# Patient Record
Sex: Female | Born: 1985 | Race: White | Hispanic: No | Marital: Married | Smoking: Former smoker
Health system: Southern US, Community
[De-identification: ages and names within clinical notes are randomized; demographics above are authoritative.]

## PROBLEM LIST (undated history)

## (undated) DIAGNOSIS — R7301 Impaired fasting glucose: Secondary | ICD-10-CM

## (undated) DIAGNOSIS — N912 Amenorrhea, unspecified: Secondary | ICD-10-CM

## (undated) DIAGNOSIS — R7989 Other specified abnormal findings of blood chemistry: Secondary | ICD-10-CM

## (undated) HISTORY — PX: WISDOM TOOTH EXTRACTION: SHX21

---

## 2019-04-03 ENCOUNTER — Other Ambulatory Visit: Payer: Self-pay | Admitting: Gastroenterology

## 2019-04-03 DIAGNOSIS — R1031 Right lower quadrant pain: Secondary | ICD-10-CM

## 2019-04-03 DIAGNOSIS — R1032 Left lower quadrant pain: Secondary | ICD-10-CM

## 2019-04-10 ENCOUNTER — Ambulatory Visit
Admission: RE | Admit: 2019-04-10 | Discharge: 2019-04-10 | Disposition: A | Discharge status: Home / Self Care | Payer: Managed Care, Other (non HMO) | Source: Ambulatory Visit | Attending: Gastroenterology | Admitting: Gastroenterology

## 2019-04-10 ENCOUNTER — Other Ambulatory Visit: Payer: Self-pay

## 2019-04-10 DIAGNOSIS — R1032 Left lower quadrant pain: Secondary | ICD-10-CM | POA: Diagnosis present

## 2019-04-10 DIAGNOSIS — R1031 Right lower quadrant pain: Secondary | ICD-10-CM | POA: Insufficient documentation

## 2019-04-10 MED ORDER — IOHEXOL 300 MG/ML  SOLN
100.0000 mL | Freq: Once | INTRAMUSCULAR | Status: AC | PRN
  Administered 2019-04-10: 100 mL via INTRAVENOUS

## 2019-07-12 ENCOUNTER — Other Ambulatory Visit: Payer: Managed Care, Other (non HMO) | Attending: General Surgery

## 2019-10-19 ENCOUNTER — Other Ambulatory Visit: Payer: Self-pay

## 2019-10-19 ENCOUNTER — Other Ambulatory Visit
Admission: RE | Admit: 2019-10-19 | Discharge: 2019-10-19 | Disposition: A | Discharge status: Home / Self Care | Payer: Managed Care, Other (non HMO) | Source: Ambulatory Visit | Attending: Internal Medicine | Admitting: Internal Medicine

## 2019-10-19 DIAGNOSIS — Z20822 Contact with and (suspected) exposure to covid-19: Secondary | ICD-10-CM | POA: Diagnosis not present

## 2019-10-19 DIAGNOSIS — Z01812 Encounter for preprocedural laboratory examination: Secondary | ICD-10-CM | POA: Diagnosis present

## 2019-10-19 LAB — SARS CORONAVIRUS 2 (TAT 6-24 HRS): SARS Coronavirus 2: NEGATIVE

## 2019-10-20 ENCOUNTER — Encounter: Payer: Self-pay | Admitting: Internal Medicine

## 2019-10-23 ENCOUNTER — Encounter
Admission: RE | Disposition: A | Discharge status: Home / Self Care | Payer: Self-pay | Source: Home / Self Care | Attending: Internal Medicine

## 2019-10-23 ENCOUNTER — Encounter: Payer: Self-pay | Admitting: Internal Medicine

## 2019-10-23 ENCOUNTER — Ambulatory Visit: Payer: Managed Care, Other (non HMO) | Admitting: Anesthesiology

## 2019-10-23 ENCOUNTER — Other Ambulatory Visit: Payer: Self-pay

## 2019-10-23 ENCOUNTER — Ambulatory Visit
Admission: RE | Admit: 2019-10-23 | Discharge: 2019-10-23 | Disposition: A | Discharge status: Home / Self Care | Payer: Managed Care, Other (non HMO) | Attending: Internal Medicine | Admitting: Internal Medicine

## 2019-10-23 DIAGNOSIS — K640 First degree hemorrhoids: Secondary | ICD-10-CM | POA: Diagnosis not present

## 2019-10-23 DIAGNOSIS — F17200 Nicotine dependence, unspecified, uncomplicated: Secondary | ICD-10-CM | POA: Diagnosis not present

## 2019-10-23 DIAGNOSIS — R634 Abnormal weight loss: Secondary | ICD-10-CM | POA: Insufficient documentation

## 2019-10-23 DIAGNOSIS — Z6841 Body Mass Index (BMI) 40.0 and over, adult: Secondary | ICD-10-CM | POA: Diagnosis not present

## 2019-10-23 DIAGNOSIS — R1030 Lower abdominal pain, unspecified: Secondary | ICD-10-CM | POA: Diagnosis not present

## 2019-10-23 DIAGNOSIS — K635 Polyp of colon: Secondary | ICD-10-CM | POA: Diagnosis not present

## 2019-10-23 DIAGNOSIS — R194 Change in bowel habit: Secondary | ICD-10-CM | POA: Diagnosis not present

## 2019-10-23 HISTORY — DX: Impaired fasting glucose: R73.01

## 2019-10-23 HISTORY — DX: Other specified abnormal findings of blood chemistry: R79.89

## 2019-10-23 HISTORY — PX: COLONOSCOPY WITH PROPOFOL: SHX5780

## 2019-10-23 HISTORY — DX: Amenorrhea, unspecified: N91.2

## 2019-10-23 LAB — POCT PREGNANCY, URINE
Preg Test, Ur: NEGATIVE
Preg Test, Ur: NEGATIVE

## 2019-10-23 SURGERY — COLONOSCOPY WITH PROPOFOL
Anesthesia: General

## 2019-10-23 MED ORDER — LIDOCAINE HCL (CARDIAC) PF 100 MG/5ML IV SOSY
PREFILLED_SYRINGE | INTRAVENOUS | Status: DC | PRN
  Administered 2019-10-23: 40 mg via INTRAVENOUS

## 2019-10-23 MED ORDER — PROPOFOL 500 MG/50ML IV EMUL
INTRAVENOUS | Status: DC | PRN
  Administered 2019-10-23: 150 ug/kg/min via INTRAVENOUS

## 2019-10-23 MED ORDER — PROPOFOL 10 MG/ML IV BOLUS
INTRAVENOUS | Status: DC | PRN
  Administered 2019-10-23: 90 mg via INTRAVENOUS

## 2019-10-23 MED ORDER — MIDAZOLAM HCL 2 MG/2ML IJ SOLN
INTRAMUSCULAR | Status: DC | PRN
  Administered 2019-10-23: 2 mg via INTRAVENOUS

## 2019-10-23 MED ORDER — PROPOFOL 500 MG/50ML IV EMUL
INTRAVENOUS | Status: AC
  Filled 2019-10-23: qty 50

## 2019-10-23 MED ORDER — MIDAZOLAM HCL 2 MG/2ML IJ SOLN
INTRAMUSCULAR | Status: AC
  Filled 2019-10-23: qty 2

## 2019-10-23 MED ORDER — SODIUM CHLORIDE 0.9 % IV SOLN
INTRAVENOUS | Status: DC
  Administered 2019-10-23: 1000 mL via INTRAVENOUS

## 2019-10-23 NOTE — Anesthesia Preprocedure Evaluation (Signed)
Anesthesia Evaluation  Patient identified by MRN, date of birth, ID band Patient awake    Reviewed: Allergy & Precautions, H&P , NPO status , Patient's Chart, lab work & pertinent test results  History of Anesthesia Complications Negative for: history of anesthetic complications  Airway Mallampati: III  TM Distance: >3 FB Neck ROM: full    Dental  (+) Chipped   Pulmonary neg shortness of breath, Current Smoker and Patient abstained from smoking.,    Pulmonary exam normal        Cardiovascular Exercise Tolerance: Good (-) angina(-) Past MI and (-) DOE negative cardio ROS Normal cardiovascular exam     Neuro/Psych negative neurological ROS  negative psych ROS   GI/Hepatic negative GI ROS, Neg liver ROS, neg GERD  ,  Endo/Other  diabetesMorbid obesity  Renal/GU negative Renal ROS  negative genitourinary   Musculoskeletal   Abdominal   Peds  Hematology negative hematology ROS (+)   Anesthesia Other Findings Past Medical History: No date: Amenorrhea No date: High serum testosterone No date: IFG (impaired fasting glucose)  Past Surgical History: No date: WISDOM TOOTH EXTRACTION  BMI    Body Mass Index: 43.90 kg/m      Reproductive/Obstetrics negative OB ROS                             Anesthesia Physical Anesthesia Plan  ASA: III  Anesthesia Plan: General   Post-op Pain Management:    Induction: Intravenous  PONV Risk Score and Plan: Propofol infusion and TIVA  Airway Management Planned: Natural Airway and Nasal Cannula  Additional Equipment:   Intra-op Plan:   Post-operative Plan:   Informed Consent: I have reviewed the patients History and Physical, chart, labs and discussed the procedure including the risks, benefits and alternatives for the proposed anesthesia with the patient or authorized representative who has indicated his/her understanding and acceptance.      Dental Advisory Given  Plan Discussed with: Anesthesiologist, CRNA and Surgeon  Anesthesia Plan Comments: (Patient consented for risks of anesthesia including but not limited to:  - adverse reactions to medications - risk of intubation if required - damage to eyes, teeth, lips or other oral mucosa - nerve damage due to positioning  - sore throat or hoarseness - Damage to heart, brain, nerves, lungs, other parts of body or loss of life  Patient voiced understanding.)        Anesthesia Quick Evaluation

## 2019-10-23 NOTE — Interval H&P Note (Signed)
History and Physical Interval Note:  10/23/2019 3:43 PM  Casey Ward  has presented today for surgery, with the diagnosis of BIL.LOWER ABDOMINAL PAIN.  The various methods of treatment have been discussed with the patient and family. After consideration of risks, benefits and other options for treatment, the patient has consented to  Procedure(s): COLONOSCOPY WITH PROPOFOL (N/A) as a surgical intervention.  The patient's history has been reviewed, patient examined, no change in status, stable for surgery.  I have reviewed the patient's chart and labs.  Questions were answered to the patient's satisfaction.     [place], [place]

## 2019-10-23 NOTE — H&P (Signed)
Outpatient short stay form Pre-procedure 10/23/2019 3:42 PM Dereke Neumann K. Norma Fredrickson, M.D.  Primary Physician: None  Reason for visit: Change in bowel habits, weight loss  History of present illness: 34 y/o female with hx of chronic constipation c/o weight loss and abdominal bloating. No family history of IBD or colon cancer.    Current Facility-Administered Medications:  .  0.9 %  sodium chloride infusion, , Intravenous, Continuous, [place], Boykin Nearing, MD, Last Rate: 20 mL/hr at 10/23/19 1539, Continued from Pre-op at 10/23/19 1539  Medications Prior to Admission  Medication Sig Dispense Refill Last Dose  . hyoscyamine (LEVSIN SL) 0.125 MG SL tablet Place 0.125 mg under the tongue every 4 (four) hours as needed.        No Known Allergies   Past Medical History:  Diagnosis Date  . Amenorrhea   . High serum testosterone   . IFG (impaired fasting glucose)     Review of systems:  Otherwise negative.    Physical Exam  Gen: Alert, oriented. Appears stated age.  HEENT: [place]/AT. PERRLA. Lungs: CTA, no wheezes. CV: RR nl S1, S2. Abd: soft, benign, no masses. BS+ Ext: No edema. Pulses 2+    Planned procedures: Proceed with colonoscopy. The patient understands the nature of the planned procedure, indications, risks, alternatives and potential complications including but not limited to bleeding, infection, perforation, damage to internal organs and possible oversedation/side effects from anesthesia. The patient agrees and gives consent to proceed.  Please refer to procedure notes for findings, recommendations and patient disposition/instructions.     Taden Witter K. Norma Fredrickson, M.D. Gastroenterology 10/23/2019  3:42 PM

## 2019-10-23 NOTE — Anesthesia Procedure Notes (Addendum)
Date/Time: 10/23/2019 3:56 PM Performed by: Stormy Fabian, CRNA Pre-anesthesia Checklist: Patient identified, Emergency Drugs available, Suction available and Patient being monitored Patient Re-evaluated:Patient Re-evaluated prior to induction Oxygen Delivery Method: Supernova nasal CPAP Induction Type: IV induction Dental Injury: Teeth and Oropharynx as per pre-operative assessment  Comments: Nasal cannula with etCO2 monitoring

## 2019-10-23 NOTE — Op Note (Addendum)
[facility] Gastroenterology Patient Name: Casey Ward Procedure Date: 10/23/2019 3:52 PM MRN: [id number] Account #: [id number] Date of Birth: 11-21-1985 Admit Type: Outpatient Age: 34 Room: [facility] ENDO ROOM 3 Gender: Female Note Status: Finalized Procedure:             Colonoscopy Indications:           Lower abdominal pain, Change in bowel habits, Weight                         loss Providers:             Boykin Nearing. Norma Fredrickson MD, MD Referring MD:          No Local Md, MD (Referring MD) Medicines:             Propofol per Anesthesia Complications:         No immediate complications. Procedure:             Pre-Anesthesia Assessment:                        - The risks and benefits of the procedure and the                         sedation options and risks were discussed with the                         patient. All questions were answered and informed                         consent was obtained.                        - Patient identification and proposed procedure were                         verified prior to the procedure by the nurse. The                         procedure was verified in the procedure room.                        - ASA Grade Assessment: III - A patient with severe                         systemic disease.                        - After reviewing the risks and benefits, the patient                         was deemed in satisfactory condition to undergo the                         procedure.                        - After reviewing the risks and benefits, the patient                         was deemed in satisfactory condition  to undergo the                         procedure.                        After obtaining informed consent, the colonoscope was                         passed under direct vision. Throughout the procedure,                         the patient's blood pressure, pulse, and oxygen                         saturations were  monitored continuously. The                         Colonoscope was introduced through the anus and                         advanced to the the cecum, identified by appendiceal                         orifice and ileocecal valve. The colonoscopy was                         performed without difficulty. The patient tolerated                         the procedure well. The quality of the bowel                         preparation was excellent. The ileocecal valve,                         appendiceal orifice, and rectum were photographed. Findings:      The perianal and digital rectal examinations were normal. Pertinent       negatives include normal sphincter tone and no palpable rectal lesions.      Two sessile polyps were found in the cecum and ileocecal valve. The       polyps were diminutive in size. These polyps were removed with a jumbo       cold forceps. Resection and retrieval were complete.      Non-bleeding internal hemorrhoids were found during retroflexion. The       hemorrhoids were Grade I (internal hemorrhoids that do not prolapse).      The exam was otherwise without abnormality. Impression:            - Two diminutive polyps in the cecum and at the                         ileocecal valve, removed with a jumbo cold forceps.                         Resected and retrieved.                        - Non-bleeding internal hemorrhoids.                        -  The examination was otherwise normal. Recommendation:        - Patient has a contact number available for                         emergencies. The signs and symptoms of potential                         delayed complications were discussed with the patient.                         Return to normal activities tomorrow. Written                         discharge instructions were provided to the patient.                        - Resume previous diet.                        - Continue present medications.                         - Repeat colonoscopy is recommended for surveillance.                         The colonoscopy date will be determined after                         pathology results from today's exam become available                         for review.                        - Return to nurse practitioner as previously scheduled.                        - Follow up with Vevelyn Pat, GI Nurse                         Practioner, in office to discuss results and monitor                         progress.                        - The findings and recommendations were discussed with                         the patient. Procedure Code(s):     --- Professional ---                        [phone], Colonoscopy, flexible; with biopsy, single or                         multiple Diagnosis Code(s):     --- Professional ---                        R63.4, Abnormal weight loss  R19.4, Change in bowel habit                        R10.30, Lower abdominal pain, unspecified                        K64.0, First degree hemorrhoids                        K63.5, Polyp of colon CPT copyright 2019 American Medical Association. All rights reserved. The codes documented in this report are preliminary and upon coder review may  be revised to meet current compliance requirements. Stanton Kidney MD, MD 10/23/2019 4:16:23 PM This report has been signed electronically. Number of Addenda: 0 Note Initiated On: 10/23/2019 3:52 PM Scope Withdrawal Time: 0 hours 6 minutes 31 seconds  Total Procedure Duration: 0 hours 11 minutes 37 seconds  Estimated Blood Loss:  Estimated blood loss: none.      [facility]

## 2019-10-23 NOTE — Transfer of Care (Signed)
Immediate Anesthesia Transfer of Care Note  Patient: Casey Ward  Procedure(s) Performed: Procedure(s): COLONOSCOPY WITH PROPOFOL (N/A)  Patient Location: PACU and Endoscopy Unit  Anesthesia Type:General  Level of Consciousness: sedated  Airway & Oxygen Therapy: Patient Spontanous Breathing and Patient connected to nasal cannula oxygen  Post-op Assessment: Report given to RN and Post -op Vital signs reviewed and stable  Post vital signs: Reviewed and stable  Last Vitals:  Vitals:   10/23/19 1422 10/23/19 1615  BP: (!) 132/108   Pulse:  (P) 99  Resp: 20 (!) (P) 22  Temp: (!) 35.8 C (P) 37 C  SpO2: 97%     Complications: No apparent anesthesia complications

## 2019-10-24 ENCOUNTER — Encounter: Payer: Self-pay | Admitting: Internal Medicine

## 2019-10-25 LAB — SURGICAL PATHOLOGY

## 2019-11-01 NOTE — Anesthesia Postprocedure Evaluation (Signed)
Anesthesia Post Note  Patient: Psychologist, occupational  Procedure(s) Performed: COLONOSCOPY WITH PROPOFOL (N/A )  Patient location during evaluation: PACU Anesthesia Type: General Level of consciousness: awake and alert Pain management: pain level controlled Vital Signs Assessment: post-procedure vital signs reviewed and stable Respiratory status: spontaneous breathing, nonlabored ventilation, respiratory function stable and patient connected to nasal cannula oxygen Cardiovascular status: blood pressure returned to baseline and stable Postop Assessment: no apparent nausea or vomiting Anesthetic complications: no   No complications documented.   Last Vitals:  Vitals:   10/23/19 1625 10/23/19 1635  BP: (!) 114/91 113/84  Pulse:    Resp:  20  Temp:    SpO2:      Last Pain:  Vitals:   10/24/19 0750  TempSrc:   PainSc: 0-No pain                 Yevette Edwards

## 2020-09-21 IMAGING — CT CT ABD-PELV W/ CM
1 of 2 series · 15 of 32 positions shown, 19 images · IV contrast (omnipaque)
Comparison: No priors.

CLINICAL DATA: 34-year-old female complaining of lower pelvic and
abdominal pain for 1 year, worsening over the past 3 months. Nausea
without vomiting.

EXAM:
CT ABDOMEN AND PELVIS WITH CONTRAST
TECHNIQUE: Multidetector CT imaging of the abdomen and pelvis was performed
using the standard protocol following bolus administration of
intravenous contrast.
CONTRAST:  100mL OMNIPAQUE IOHEXOL 300 MG/ML  SOLN

[Series 2: axial st · axial · 0.81mm/px · z∈[-954,-499]mm · 15 of 101 slices shown, 19 images]
[im 5/101  soft-tissue]
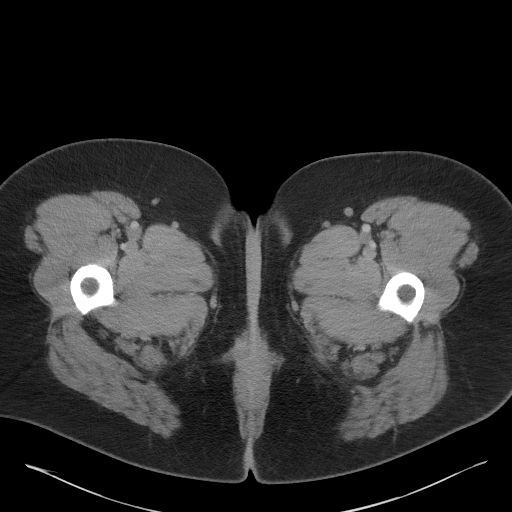
[im 5/101  bone]
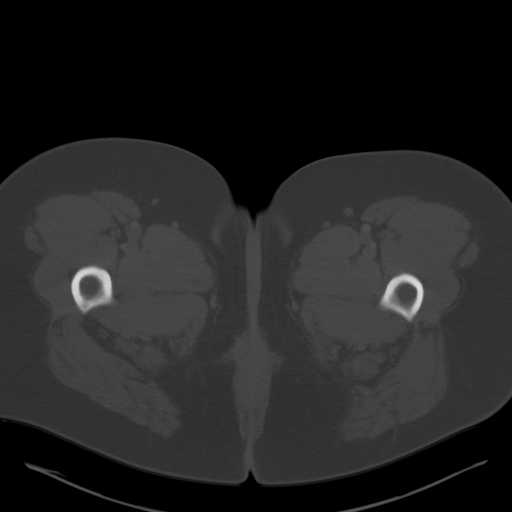
[im 13/101  soft-tissue]
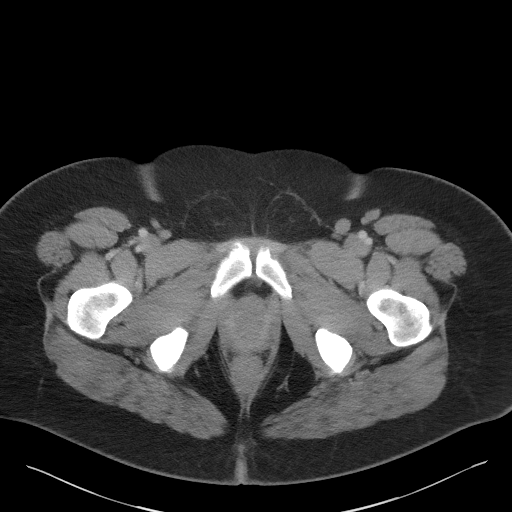
[im 21/101  soft-tissue]
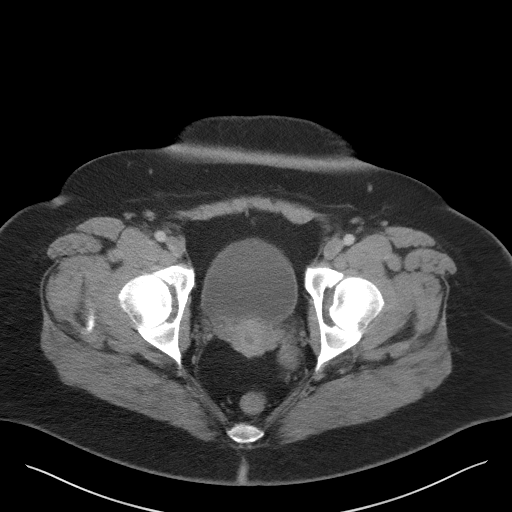
[im 30/101  soft-tissue]
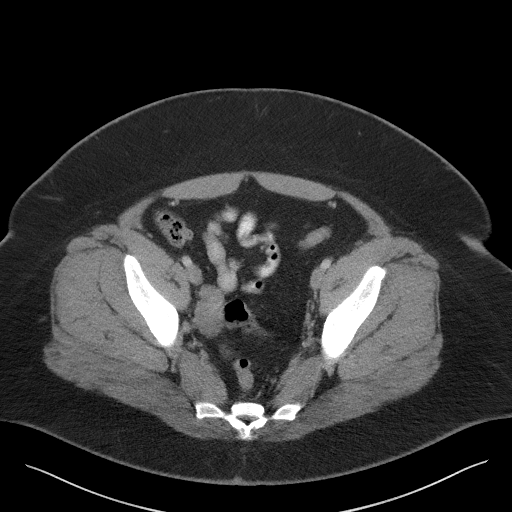
[im 34/101  soft-tissue]
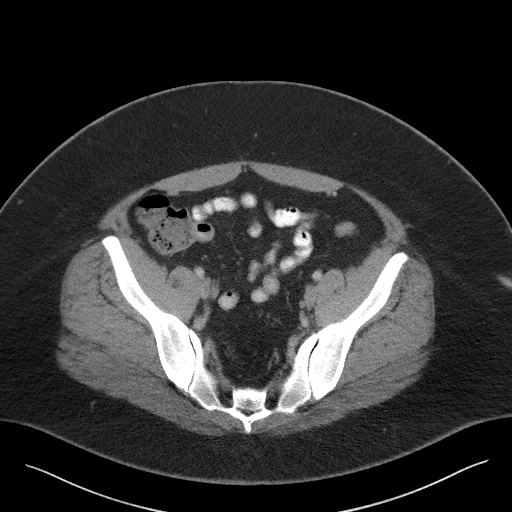
[im 42/101  soft-tissue]
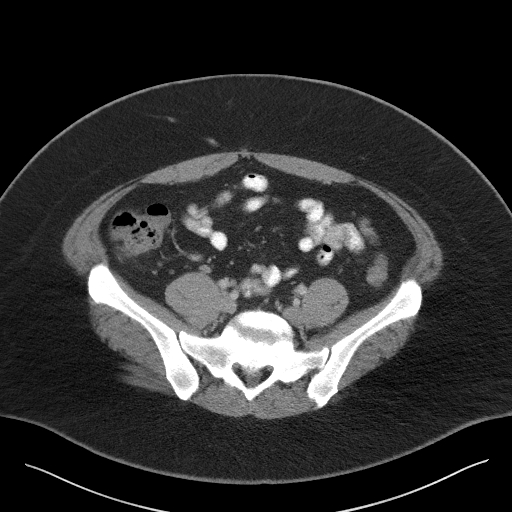
[im 51/101  soft-tissue]
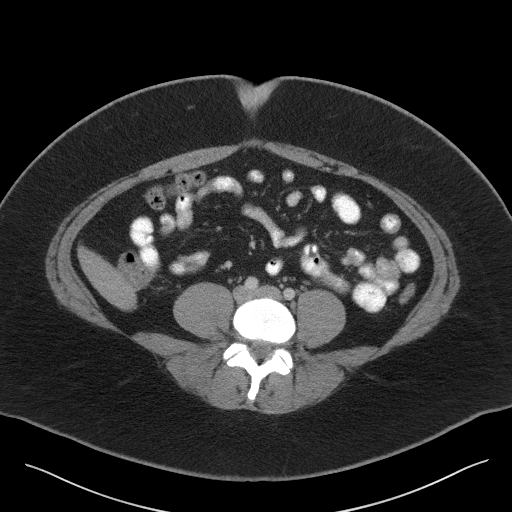
[im 59/101  soft-tissue]
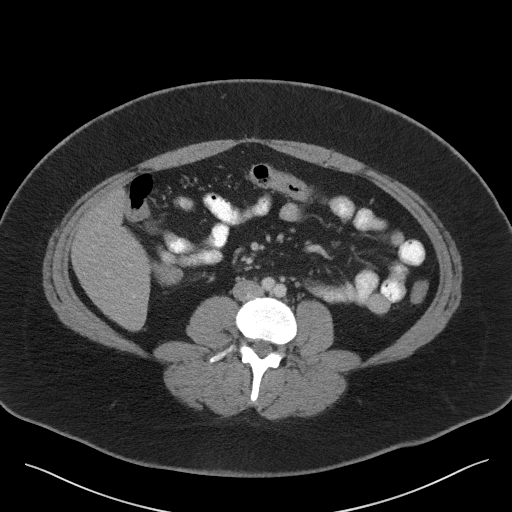
[im 67/101  soft-tissue]
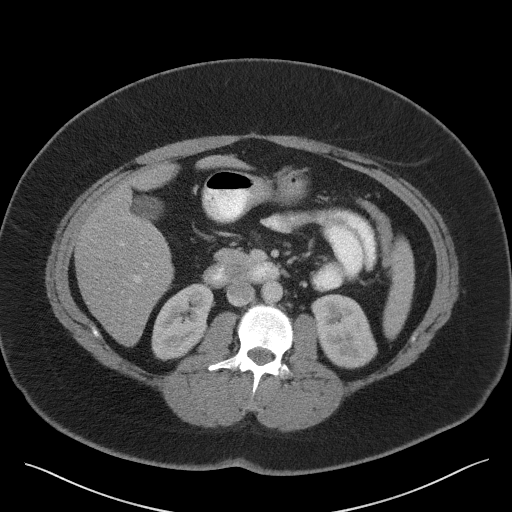
[im 67/101  bone]
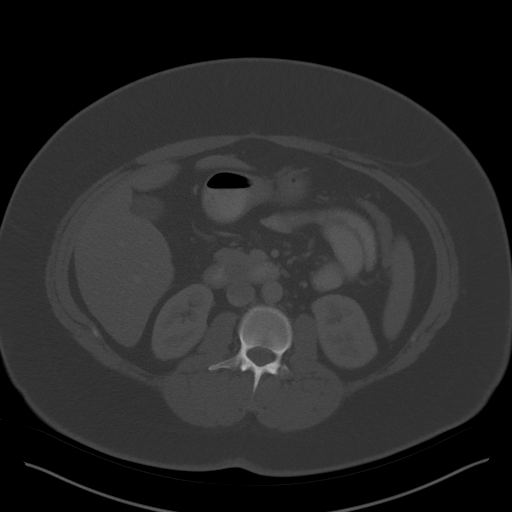
[im 71/101  soft-tissue]
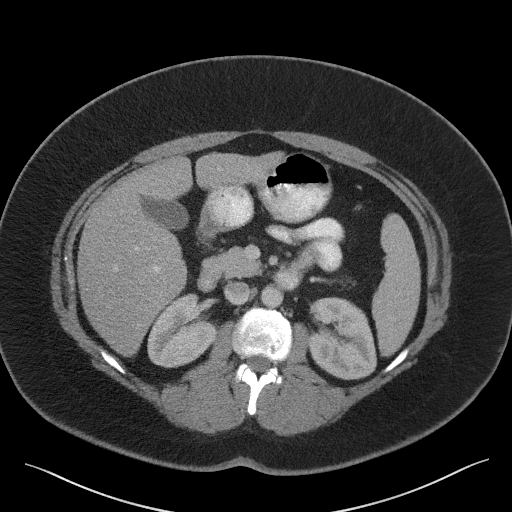
[im 80/101  soft-tissue]
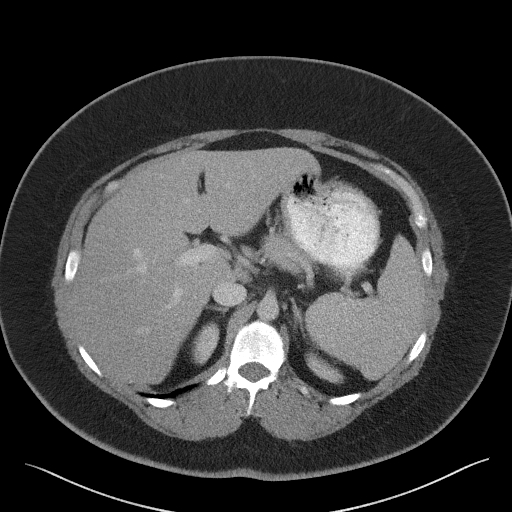
[im 84/101  lung]
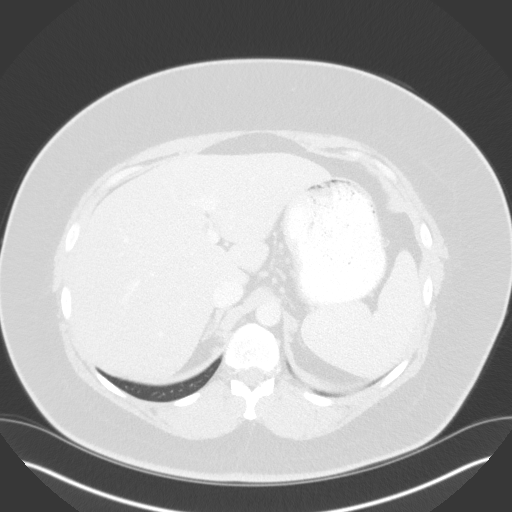
[im 88/101  soft-tissue]
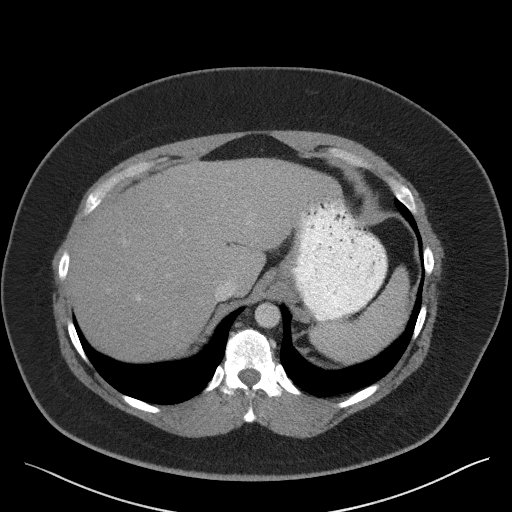
[im 88/101  lung]
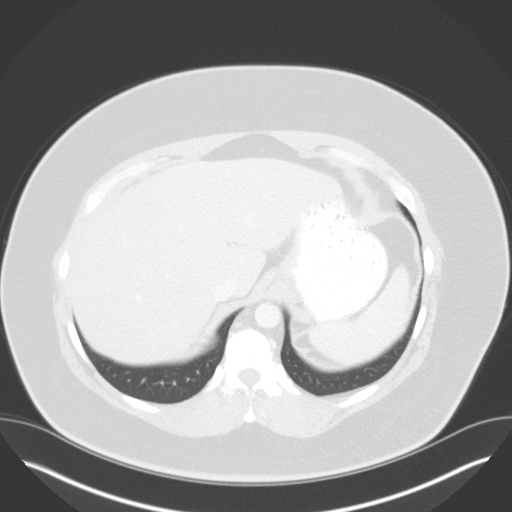
[im 92/101  lung]
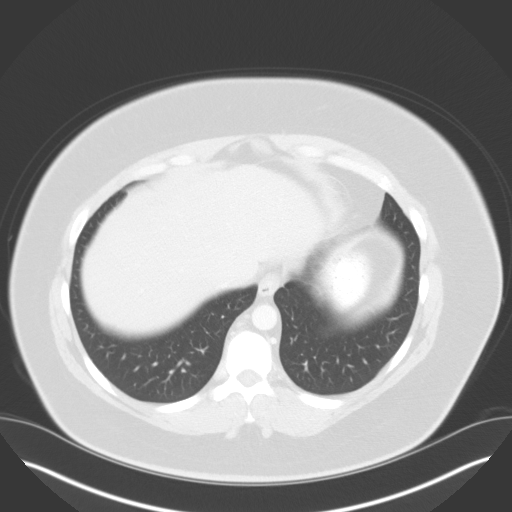
[im 96/101  soft-tissue]
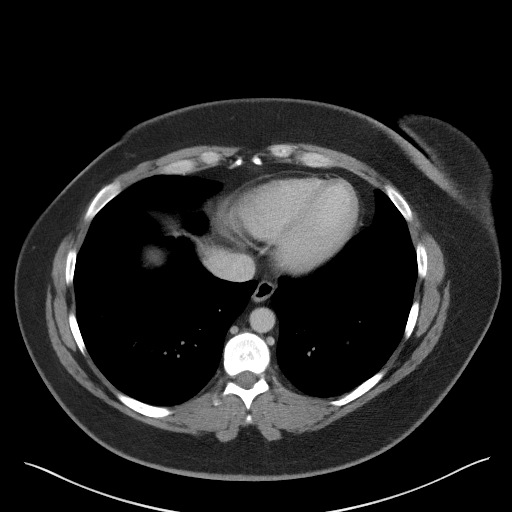
[im 96/101  lung]
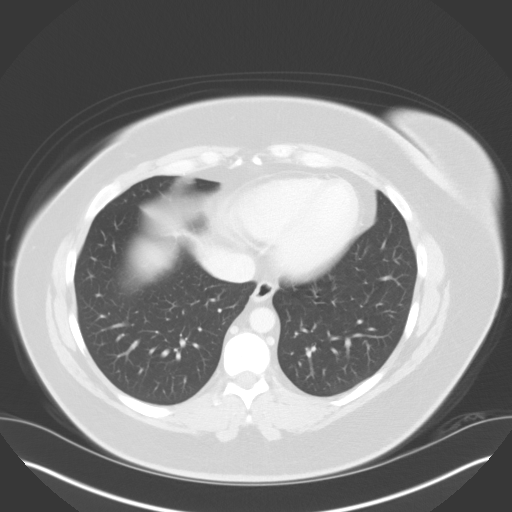

[15 of 32 positions shown; findings below may reference images not displayed]

FINDINGS: Lower chest: Unremarkable.

Hepatobiliary: No suspicious cystic or solid hepatic lesions. No
intra or extrahepatic biliary ductal dilatation. Gallbladder is
normal in appearance.

Pancreas: No pancreatic mass. No pancreatic ductal dilatation. No
pancreatic or peripancreatic fluid collections or inflammatory
changes.

Spleen: Unremarkable.

Adrenals/Urinary Tract: Bilateral kidneys and adrenal glands are
normal in appearance. No hydroureteronephrosis. Urinary bladder is
normal in appearance.

Stomach/Bowel: Normal appearance of the stomach. No pathologic
dilatation of small bowel or colon. Normal appendix.

Vascular/Lymphatic: No significant atherosclerotic disease, aneurysm
or dissection noted in the abdominal or pelvic vasculature. No
lymphadenopathy noted in the abdomen or pelvis.

Reproductive: Small low-attenuation lesions in the region of the
cervix, favored to be Nabothian cysts. The appearance of the uterus
and ovaries is otherwise unremarkable.

Other: There are no significant volume of ascites. No
pneumoperitoneum.

Musculoskeletal: There are no aggressive appearing lytic or blastic
lesions noted in the visualized portions of the skeleton.
IMPRESSION: 1. No acute findings in the abdomen or pelvis to account for the
patient's symptoms.
2. The small low-attenuation lesions in the region of the cervix,
favored to represent Nabothian cysts.

## 2022-07-19 ENCOUNTER — Ambulatory Visit
Admission: EM | Admit: 2022-07-19 | Discharge: 2022-07-19 | Disposition: A | Discharge status: Home / Self Care | Payer: BLUE CROSS/BLUE SHIELD | Attending: Emergency Medicine | Admitting: Emergency Medicine

## 2022-07-19 ENCOUNTER — Encounter: Payer: Self-pay | Admitting: Emergency Medicine

## 2022-07-19 DIAGNOSIS — U071 COVID-19: Secondary | ICD-10-CM

## 2022-07-19 DIAGNOSIS — J0100 Acute maxillary sinusitis, unspecified: Secondary | ICD-10-CM

## 2022-07-19 DIAGNOSIS — Z20822 Contact with and (suspected) exposure to covid-19: Secondary | ICD-10-CM | POA: Diagnosis not present

## 2022-07-19 DIAGNOSIS — J029 Acute pharyngitis, unspecified: Secondary | ICD-10-CM

## 2022-07-19 LAB — SARS CORONAVIRUS 2 BY RT PCR: SARS Coronavirus 2 by RT PCR: POSITIVE — AB

## 2022-07-19 LAB — PREGNANCY, URINE: Preg Test, Ur: NEGATIVE

## 2022-07-19 LAB — GROUP A STREP BY PCR: Group A Strep by PCR: NOT DETECTED

## 2022-07-19 MED ORDER — FLUTICASONE PROPIONATE 50 MCG/ACT NA SUSP: 2.0000 | Freq: Every day | NASAL | 0 refills | Status: AC

## 2022-07-19 MED ORDER — AMOXICILLIN-POT CLAVULANATE 875-125 MG PO TABS: 1.0000 | ORAL_TABLET | Freq: Two times a day (BID) | ORAL | 0 refills | Status: AC

## 2022-07-19 MED ORDER — IBUPROFEN 600 MG PO TABS: 600.0000 mg | ORAL_TABLET | Freq: Three times a day (TID) | ORAL | 0 refills | Status: AC | PRN

## 2022-07-19 NOTE — ED Provider Notes (Signed)
HPI  SUBJECTIVE:  Casey Ward is a 37 y.o. female who presents with 4 days of a sore throat, nasal congestion, clear rhinorrhea, fevers Tmax 103, left ear pain, body aches, headaches, sinus pain and pressure, wheezing and a cough productive of green mucus starting today.  She reports decreased hearing out of the left ear.  No otorrhea.  No facial swelling, upper dental pain, postnasal drip, nausea, vomiting, diarrhea, abdominal pain, rash.  She tried TheraFlu and increasing fluids, ibuprofen, Sudafed.  TheraFlu helps.  No aggravating factors.  No known strep, COVID, flu exposure.  She did travel recently.  She got 4 doses of the COVID-vaccine.  Did not get this years flu vaccine.  No antibiotics in the past month.  No antipyretic in the past 6 hours.  She has no past medical history.  Last menstrual period February 19, states that she is normally irregular, but would like to check for pregnancy.  PCP: [facility].    Past Medical History:  Diagnosis Date   Amenorrhea    High serum testosterone    IFG (impaired fasting glucose)     Past Surgical History:  Procedure Laterality Date   COLONOSCOPY WITH PROPOFOL N/A 10/23/2019   Procedure: COLONOSCOPY WITH PROPOFOL;  Surgeon: [place], Boykin Nearing, MD;  Location: [facility] ENDOSCOPY;  Service: Endoscopy;  Laterality: N/A;   WISDOM TOOTH EXTRACTION      History reviewed. No pertinent family history.  Social History   Tobacco Use   Smoking status: Former    Types: Cigarettes    Quit date: 03/26/2017    Years since quitting: 5.3   Smokeless tobacco: Never  Vaping Use   Vaping Use: Every day  Substance Use Topics   Alcohol use: Never   Drug use: Never    No current facility-administered medications for this encounter.  Current Outpatient Medications:    amoxicillin-clavulanate (AUGMENTIN) 875-125 MG tablet, Take 1 tablet by mouth every 12 (twelve) hours., Disp: 14 tablet, Rfl: 0   fluticasone (FLONASE) 50 MCG/ACT nasal spray, Place 2 sprays  into both nostrils daily., Disp: 16 g, Rfl: 0   ibuprofen (ADVIL) 600 MG tablet, Take 1 tablet (600 mg total) by mouth every 8 (eight) hours as needed., Disp: 30 tablet, Rfl: 0   hyoscyamine (LEVSIN SL) 0.125 MG SL tablet, Place 0.125 mg under the tongue every 4 (four) hours as needed., Disp: , Rfl:   No Known Allergies   ROS  As noted in HPI.   Physical Exam  BP 120/87 (BP Location: Right Arm)   Pulse (!) 104   Temp 99.7 F (37.6 C) (Oral)   Resp 14   Ht 5\' 6"  (1.676 m)   Wt 108.9 kg   LMP 04/13/2022 (Exact Date)   SpO2 96%   BMI 38.74 kg/m   Constitutional: Well developed, well nourished, no acute distress Eyes:  EOMI, conjunctiva normal bilaterally HENT: Normocephalic, atraumatic,mucus membranes moist.   No pain with traction of pinna, palpation of tragus or mastoid bilaterally.  EACs, TMs normal bilaterally.  No TMJ tenderness.  Erythematous, swollen turbinates with purulent nasal congestion.  Positive left maxillary sinus tenderness.  Erythematous oropharynx.  Erythematous, swollen tonsils without exudates.  Uvula midline.  No obvious postnasal drip. Neck: Positive anterior cervical lymphadenopathy Respiratory: Normal inspiratory effort lungs clear bilaterally Cardiovascular: Tachycardia, no murmurs rubs or gallops GI: nondistended soft, no splenomegaly skin: No rash, skin intact Musculoskeletal: no deformities Neurologic: Alert & oriented x 3, no focal neuro deficits Psychiatric: Speech and behavior appropriate  ED Course   Medications - No data to display  Orders Placed This Encounter  Procedures   Group A Strep by PCR    Standing Status:   Standing    Number of Occurrences:   1   SARS Coronavirus 2 by RT PCR (hospital order, performed in [facility] hospital lab) *cepheid single result test* Anterior Nasal Swab    Standing Status:   Standing    Number of Occurrences:   1   Pregnancy, urine    Standing Status:   Standing    Number of Occurrences:   1     Results for orders placed or performed during the hospital encounter of 07/19/22 (from the past 24 hour(s))  Group A Strep by PCR     Status: None   Collection Time: 07/19/22  8:19 AM   Specimen: Throat; Sterile Swab  Result Value Ref Range   Group A Strep by PCR NOT DETECTED NOT DETECTED  SARS Coronavirus 2 by RT PCR (hospital order, performed in [facility] hospital lab) *cepheid single result test* Anterior Nasal Swab     Status: Abnormal   Collection Time: 07/19/22  8:19 AM   Specimen: Anterior Nasal Swab  Result Value Ref Range   SARS Coronavirus 2 by RT PCR POSITIVE (A) NEGATIVE  Pregnancy, urine     Status: None   Collection Time: 07/19/22  8:45 AM  Result Value Ref Range   Preg Test, Ur NEGATIVE NEGATIVE   No results found.  ED Clinical Impression  1. COVID-19 virus infection   2. Pharyngitis, unspecified etiology   3. Acute non-recurrent maxillary sinusitis   4. Encounter for laboratory testing for COVID-19 virus      ED Assessment/Plan     Patient presents with an acute illness with systemic symptoms of tachycardia.  Checking urine pregnancy, COVID, strep.  Strep, Urine pregnancy negative.  COVID-positive.  Patient does not qualify for antivirals.  Supportive treatment. however I am concerned for a secondary acute maxillary sinusitis.  Will send home with a wait-and-see prescription of Augmentin for 7 days due to the fever above 102, left maxillary sinus tenderness.  Home with Mucinex D, saline nasal irrigation, Benadryl/Maalox mixture, Flonase.  Lungs are clear.  Cough started today, does not appear to be bothersome at this time.  She has no history of asthma.  Deferring bronchodilators at this time.  Discussed labs, MDM, treatment plan, and plan for follow-up with patient. . patient agrees with plan.   Meds ordered this encounter  Medications   fluticasone (FLONASE) 50 MCG/ACT nasal spray    Sig: Place 2 sprays into both nostrils daily.    Dispense:  16 g     Refill:  0   ibuprofen (ADVIL) 600 MG tablet    Sig: Take 1 tablet (600 mg total) by mouth every 8 (eight) hours as needed.    Dispense:  30 tablet    Refill:  0   amoxicillin-clavulanate (AUGMENTIN) 875-125 MG tablet    Sig: Take 1 tablet by mouth every 12 (twelve) hours.    Dispense:  14 tablet    Refill:  0      *This clinic note was created using Scientist, clinical (histocompatibility and immunogenetics). Therefore, there may be occasional mistakes despite careful proofreading.  ? ]   Domenick Gong, MD 07/20/22 [phone]

## 2022-07-19 NOTE — ED Triage Notes (Signed)
Patient c/o nasal congestion, sore throat, fever, and cough that started 4 days ago.  Patient has not been taking any OTC medicine.

## 2022-07-19 NOTE — Discharge Instructions (Addendum)
Your COVID is positive.  Your strep and urine pregnancy are negative.  Supportive treatment for now.  I would wait for a few days to start the Augmentin.  Started only if you get worse.  Stop TheraFlu.  Start Mucinex D, Flonase.  1 gram of Tylenol and 600 mg ibuprofen together 3-4 times a day as needed for pain.  Make sure you drink plenty of extra fluids.  Some people find salt water gargles and  Traditional Medicinal's "Throat Coat" tea helpful. Take 5 mL of liquid Benadryl and 5 mL of Maalox. Mix it together, and then hold it in your mouth for as long as you can and then swallow. You may do this 4 times a day.  Honey and lemon dissolved in hot water can also be soothing.  Saline nasal irrigation with a NeilMed sinus rinse and distilled water as often as you want.  Go to www.goodrx.com  or www.costplusdrugs.com to look up your medications. This will give you a list of where you can find your prescriptions at the most affordable prices. Or ask the pharmacist what the cash price is, or if they have any other discount programs available to help make your medication more affordable. This can be less expensive than what you would pay with insurance.
